# Patient Record
Sex: Male | Born: 2000 | Race: White | Hispanic: No | Marital: Single
Health system: Southern US, Community
[De-identification: ages and names within clinical notes are randomized; demographics above are authoritative.]

---
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
## 2021-03-04 DIAGNOSIS — Z87891 Personal history of nicotine dependence: Secondary | ICD-10-CM | POA: Diagnosis not present

## 2021-03-04 DIAGNOSIS — R002 Palpitations: Secondary | ICD-10-CM | POA: Diagnosis not present

## 2021-03-04 DIAGNOSIS — R000 Tachycardia, unspecified: Secondary | ICD-10-CM | POA: Diagnosis not present

## 2021-03-04 DIAGNOSIS — D649 Anemia, unspecified: Secondary | ICD-10-CM | POA: Diagnosis not present

## 2021-03-07 DIAGNOSIS — R82998 Other abnormal findings in urine: Secondary | ICD-10-CM | POA: Diagnosis not present

## 2021-03-07 DIAGNOSIS — R000 Tachycardia, unspecified: Secondary | ICD-10-CM | POA: Diagnosis not present

## 2021-03-07 DIAGNOSIS — Z0000 Encounter for general adult medical examination without abnormal findings: Secondary | ICD-10-CM | POA: Diagnosis not present

## 2021-05-08 DIAGNOSIS — L739 Follicular disorder, unspecified: Secondary | ICD-10-CM | POA: Diagnosis not present

## 2021-08-13 DIAGNOSIS — H7292 Unspecified perforation of tympanic membrane, left ear: Secondary | ICD-10-CM | POA: Diagnosis not present

## 2021-08-13 DIAGNOSIS — J069 Acute upper respiratory infection, unspecified: Secondary | ICD-10-CM | POA: Diagnosis not present

## 2021-10-11 DIAGNOSIS — M542 Cervicalgia: Secondary | ICD-10-CM | POA: Diagnosis not present

## 2021-10-18 DIAGNOSIS — K1379 Other lesions of oral mucosa: Secondary | ICD-10-CM | POA: Diagnosis not present

## 2021-11-02 DIAGNOSIS — J029 Acute pharyngitis, unspecified: Secondary | ICD-10-CM | POA: Diagnosis not present

## 2021-11-14 DIAGNOSIS — Z0000 Encounter for general adult medical examination without abnormal findings: Secondary | ICD-10-CM | POA: Diagnosis not present

## 2021-11-14 DIAGNOSIS — K219 Gastro-esophageal reflux disease without esophagitis: Secondary | ICD-10-CM | POA: Diagnosis not present

## 2021-11-14 DIAGNOSIS — Z1339 Encounter for screening examination for other mental health and behavioral disorders: Secondary | ICD-10-CM | POA: Diagnosis not present

## 2021-11-14 DIAGNOSIS — Z1331 Encounter for screening for depression: Secondary | ICD-10-CM | POA: Diagnosis not present

## 2022-03-27 DIAGNOSIS — M542 Cervicalgia: Secondary | ICD-10-CM | POA: Diagnosis not present

## 2022-03-27 DIAGNOSIS — R519 Headache, unspecified: Secondary | ICD-10-CM | POA: Diagnosis not present

## 2022-04-25 DIAGNOSIS — M542 Cervicalgia: Secondary | ICD-10-CM | POA: Diagnosis not present

## 2022-05-10 DIAGNOSIS — M542 Cervicalgia: Secondary | ICD-10-CM | POA: Diagnosis not present

## 2022-05-17 DIAGNOSIS — M542 Cervicalgia: Secondary | ICD-10-CM | POA: Diagnosis not present

## 2023-03-12 DIAGNOSIS — Z23 Encounter for immunization: Secondary | ICD-10-CM | POA: Diagnosis not present

## 2023-03-12 DIAGNOSIS — Z0000 Encounter for general adult medical examination without abnormal findings: Secondary | ICD-10-CM | POA: Diagnosis not present

## 2023-03-12 DIAGNOSIS — Z1331 Encounter for screening for depression: Secondary | ICD-10-CM | POA: Diagnosis not present

## 2023-03-12 DIAGNOSIS — Z1339 Encounter for screening examination for other mental health and behavioral disorders: Secondary | ICD-10-CM | POA: Diagnosis not present
